# Patient Record
Sex: Female | Born: 1995 | Race: White | Hispanic: No | Marital: Single | Smoking: Never smoker
Health system: Southern US, Community
[De-identification: ages and names within clinical notes are randomized; demographics above are authoritative.]

## PROBLEM LIST (undated history)

## (undated) DIAGNOSIS — J45909 Unspecified asthma, uncomplicated: Secondary | ICD-10-CM

## (undated) DIAGNOSIS — J189 Pneumonia, unspecified organism: Secondary | ICD-10-CM

## (undated) DIAGNOSIS — N390 Urinary tract infection, site not specified: Secondary | ICD-10-CM

## (undated) DIAGNOSIS — J40 Bronchitis, not specified as acute or chronic: Secondary | ICD-10-CM

## (undated) HISTORY — DX: Unspecified asthma, uncomplicated: J45.909

## (undated) HISTORY — DX: Bronchitis, not specified as acute or chronic: J40

## (undated) HISTORY — DX: Pneumonia, unspecified organism: J18.9

## (undated) HISTORY — DX: Urinary tract infection, site not specified: N39.0

---

## 2014-02-15 ENCOUNTER — Emergency Department: Payer: Self-pay | Admitting: Emergency Medicine

## 2015-01-14 ENCOUNTER — Encounter: Payer: Self-pay | Admitting: Internal Medicine

## 2015-03-05 ENCOUNTER — Ambulatory Visit (INDEPENDENT_AMBULATORY_CARE_PROVIDER_SITE_OTHER): Payer: BLUE CROSS/BLUE SHIELD | Admitting: Gastroenterology

## 2015-03-05 ENCOUNTER — Encounter: Payer: Self-pay | Admitting: Gastroenterology

## 2015-03-05 ENCOUNTER — Other Ambulatory Visit (INDEPENDENT_AMBULATORY_CARE_PROVIDER_SITE_OTHER): Payer: BLUE CROSS/BLUE SHIELD

## 2015-03-05 VITALS — BP 104/70 | HR 72 | Ht 68.0 in | Wt 149.0 lb

## 2015-03-05 DIAGNOSIS — R140 Abdominal distension (gaseous): Secondary | ICD-10-CM | POA: Diagnosis not present

## 2015-03-05 DIAGNOSIS — R197 Diarrhea, unspecified: Secondary | ICD-10-CM | POA: Diagnosis not present

## 2015-03-05 LAB — HEPATIC FUNCTION PANEL
ALBUMIN: 4.2 g/dL (ref 3.5–5.2)
ALT: 12 U/L (ref 0–35)
AST: 21 U/L (ref 0–37)
Alkaline Phosphatase: 51 U/L (ref 39–117)
Bilirubin, Direct: 0.1 mg/dL (ref 0.0–0.3)
Total Bilirubin: 0.4 mg/dL (ref 0.2–1.2)
Total Protein: 8 g/dL (ref 6.0–8.3)

## 2015-03-05 LAB — TSH: TSH: 3.22 u[IU]/mL (ref 0.35–4.50)

## 2015-03-05 LAB — IGA: IGA: 147 mg/dL (ref 68–378)

## 2015-03-05 MED ORDER — HYOSCYAMINE SULFATE 0.125 MG SL SUBL: SUBLINGUAL_TABLET | SUBLINGUAL | Status: AC

## 2015-03-05 NOTE — Patient Instructions (Signed)
Your physician has requested that you go to the basement for lab work before leaving today.  We have sent the following medications to your pharmacy for you to pick up at your convenience: Levsin.  Your follow up appointment with Dr. Russella DarStark is on 05/06/15 at 9:45am.   Thank you for choosing me and [facility].  Venita LickMalcolm T. Pleas KochStark, Jr., MD., Clementeen GrahamFACG

## 2015-03-05 NOTE — Progress Notes (Signed)
    History of Present Illness: This is a 19 year old female self referred for the evaluation of intermittent diarrhea and bloating. 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 She relates a many year history of intermittent episodes of urgent, watery diarrhea occurring 3-5 times per day associated with lower abdominal bloating and mild nausea. Between these episodes she has normal once daily bowel movements without bloating. She denies episodes of constipation. She cannot clearly relate any specific triggering events or foods. She states that when she is taking antibiotics the symptoms tend to flare. She has tried modifying her diet by minimizing lactose, alcohol, caffeine however this has not changed her symptoms. She was recently treated for bronchitis and just completed a Z-Pak. CBC and BMET performed at [facility] a few days ago and results were unremarkable except for minimally elevated white count of 11. Denies weight loss, abdominal pain, constipation, change in stool caliber, melena, hematochezia, vomiting, dysphagia, reflux symptoms, chest pain.  Review of Systems: Pertinent positive and negative review of systems were noted in the above HPI section. All other review of systems were otherwise negative.  Current Medications, Allergies, Past Medical History, Past Surgical History, Family History and Social History were reviewed in [facility] electronic medical record.  Physical Exam: General: Well developed, well nourished, no acute distress Head: Normocephalic and atraumatic Eyes:  sclerae anicteric, EOMI Ears: Normal auditory acuity Mouth: No deformity or lesions Neck: Supple, no masses or thyromegaly Lungs: Clear throughout to auscultation Heart: Regular rate and rhythm; no murmurs, rubs or bruits Abdomen: Soft, non tender and non distended. No masses, hepatosplenomegaly or hernias noted. Normal Bowel sounds Rectal: deferred Musculoskeletal: Symmetrical with no gross deformities    Skin: No lesions on visible extremities Pulses:  Normal pulses noted Extremities: No clubbing, cyanosis, edema or deformities noted Neurological: Alert oriented x 4, grossly nonfocal Cervical Nodes:  No significant cervical adenopathy Inguinal Nodes: No significant inguinal adenopathy Psychological:  Alert and cooperative. Normal mood and affect  Assessment and Recommendations:  1. Intermittent diarrhea, lower abdominal bloating and nausea. Suspected IBS. Obtain LFTs, TSH, TTG, IgA and stool Hemoccults. Trial of a strict lactose avoidance diet for 7-10 days. Trial of a low FODMAP diet if symptoms persist. Levsin 1-2 every 4 hours as needed. Imodium twice a day as needed. Return office visit in 2 months. Consider colonoscopy and stool studies if symptoms persist.

## 2015-03-06 LAB — TISSUE TRANSGLUTAMINASE, IGA: TISSUE TRANSGLUTAMINASE AB, IGA: 1 U/mL (ref <4)

## 2015-05-01 IMAGING — CR DG CHEST 2V
1 series · 2 of 2 positions shown · non-contrast
Comparison: None.

CLINICAL DATA: Chest tightness for 1 day

EXAM:
CHEST  2 VIEW

[Series 1: w chest pa · 0.14mm/px · 2 of 2 slices shown]
[im 1/2]
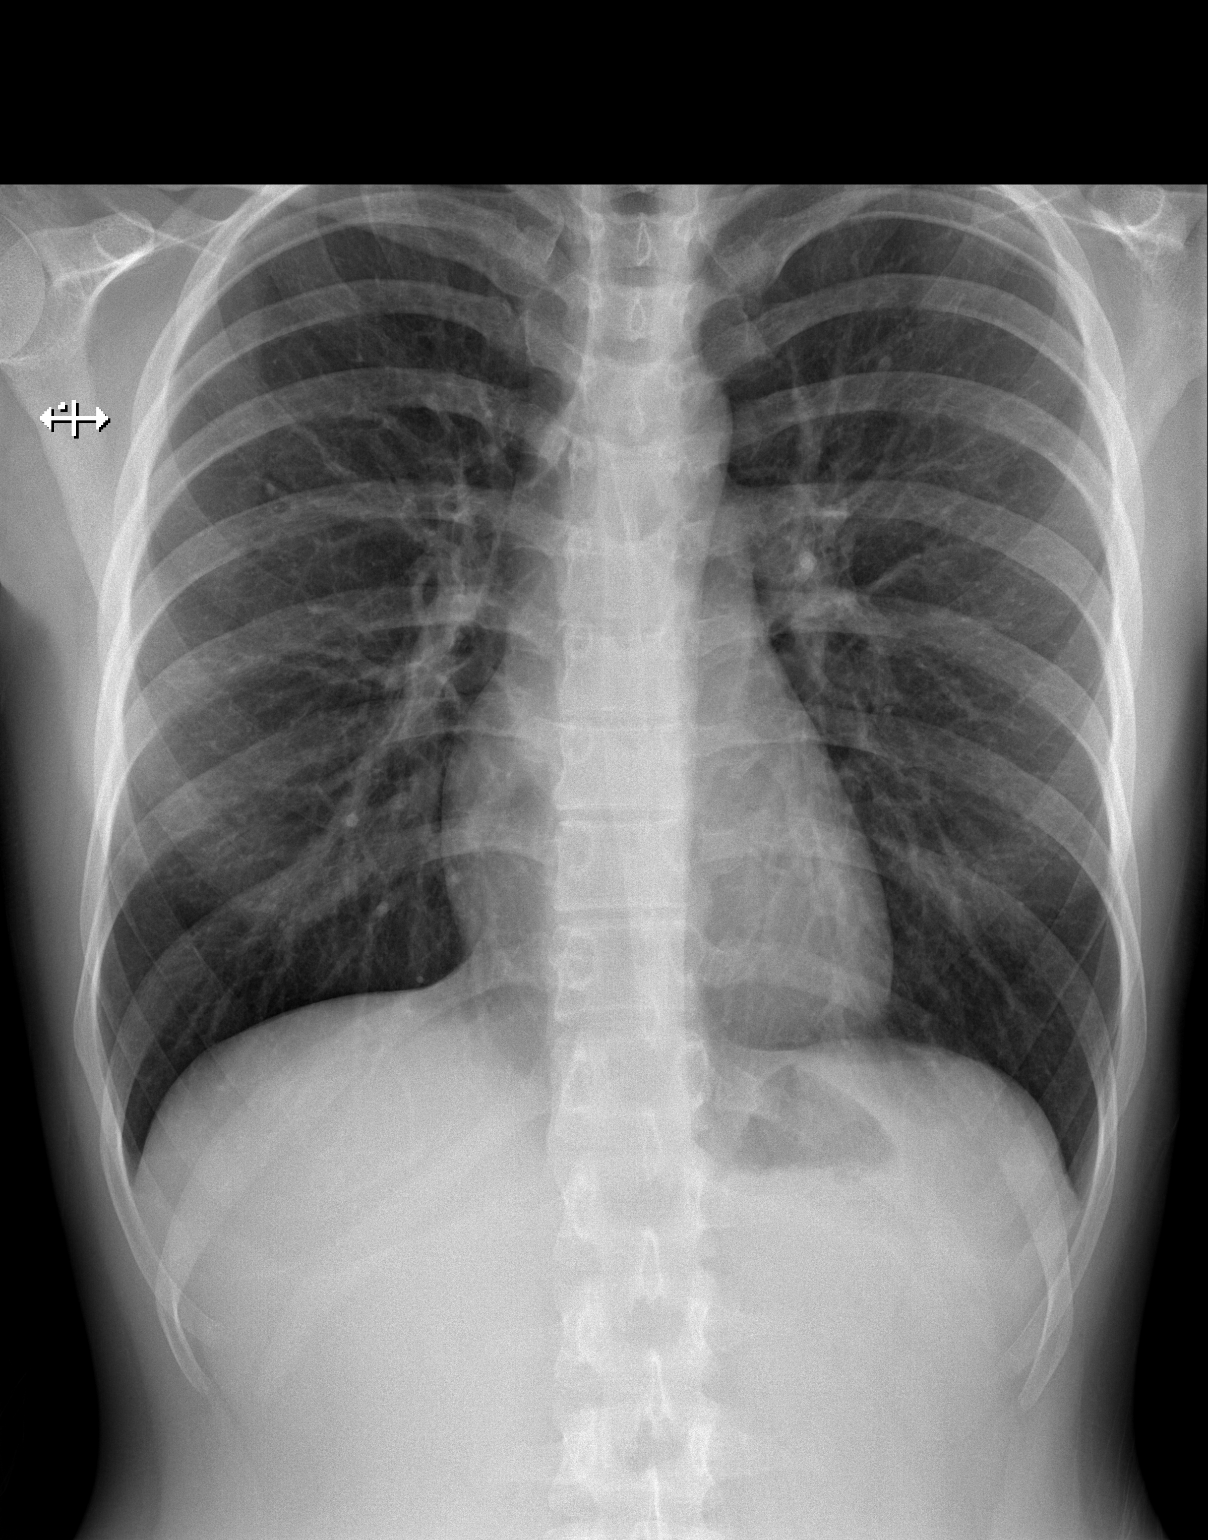
[im 2/2]
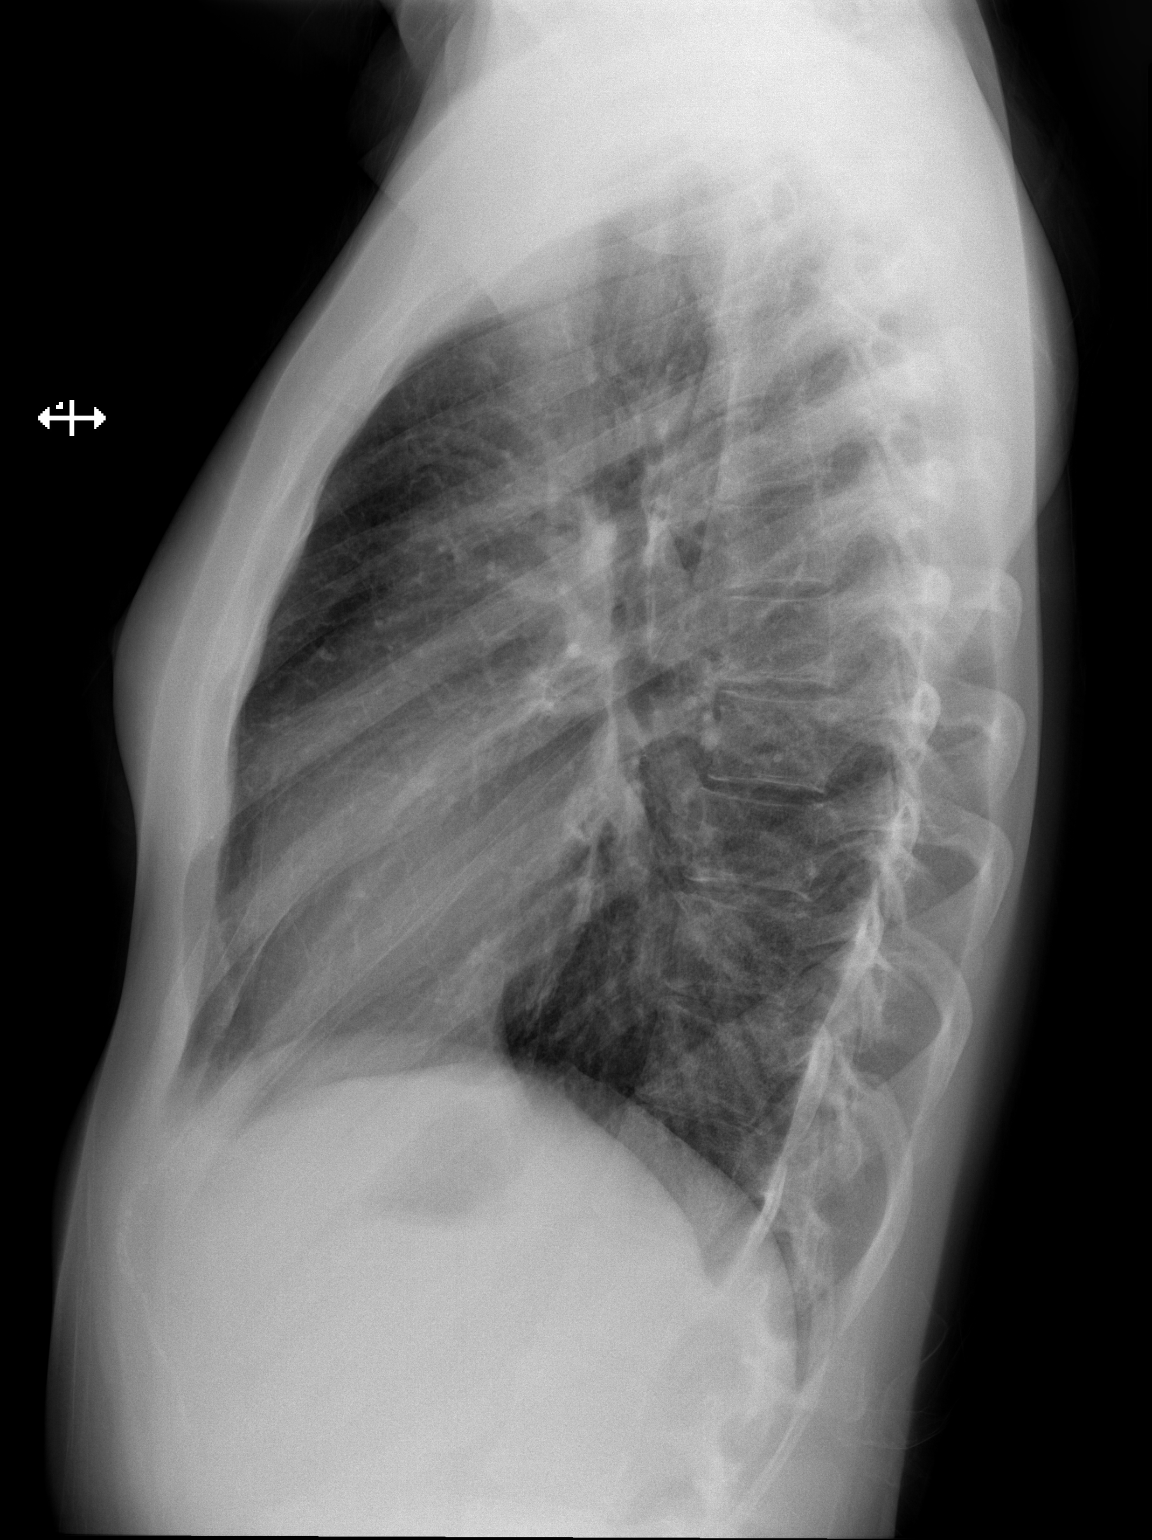

[2 of 2 positions shown; findings below may reference images not displayed]

FINDINGS: Lungs are clear. Heart size and pulmonary vascularity are normal. No
adenopathy. No pneumothorax. No bone lesions.
IMPRESSION: No abnormality noted.

## 2015-05-06 ENCOUNTER — Ambulatory Visit: Payer: BLUE CROSS/BLUE SHIELD | Admitting: Gastroenterology
